# Patient Record
Sex: Female | Born: 2000 | Race: White | Hispanic: No | Marital: Single | Smoking: Never smoker
Health system: Southern US, Community
[De-identification: ages and names within clinical notes are randomized; demographics above are authoritative.]

## PROBLEM LIST (undated history)

## (undated) DIAGNOSIS — F419 Anxiety disorder, unspecified: Secondary | ICD-10-CM

## (undated) DIAGNOSIS — Z8709 Personal history of other diseases of the respiratory system: Secondary | ICD-10-CM

## (undated) DIAGNOSIS — F819 Developmental disorder of scholastic skills, unspecified: Secondary | ICD-10-CM

## (undated) DIAGNOSIS — F909 Attention-deficit hyperactivity disorder, unspecified type: Secondary | ICD-10-CM

## (undated) DIAGNOSIS — J309 Allergic rhinitis, unspecified: Secondary | ICD-10-CM

## (undated) DIAGNOSIS — J45909 Unspecified asthma, uncomplicated: Secondary | ICD-10-CM

## (undated) DIAGNOSIS — G43909 Migraine, unspecified, not intractable, without status migrainosus: Secondary | ICD-10-CM

## (undated) DIAGNOSIS — L309 Dermatitis, unspecified: Secondary | ICD-10-CM

## (undated) DIAGNOSIS — R42 Dizziness and giddiness: Secondary | ICD-10-CM

## (undated) HISTORY — DX: Anxiety disorder, unspecified: F41.9

## (undated) HISTORY — DX: Developmental disorder of scholastic skills, unspecified: F81.9

## (undated) HISTORY — DX: Dizziness and giddiness: R42

## (undated) HISTORY — DX: Unspecified asthma, uncomplicated: J45.909

## (undated) HISTORY — DX: Personal history of other diseases of the respiratory system: Z87.09

## (undated) HISTORY — DX: Dermatitis, unspecified: L30.9

## (undated) HISTORY — DX: Allergic rhinitis, unspecified: J30.9

## (undated) HISTORY — DX: Migraine, unspecified, not intractable, without status migrainosus: G43.909

## (undated) HISTORY — PX: NO PAST SURGERIES: SHX2092

## (undated) HISTORY — DX: Attention-deficit hyperactivity disorder, unspecified type: F90.9

---
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
## 2022-01-04 DIAGNOSIS — Z3046 Encounter for surveillance of implantable subdermal contraceptive: Secondary | ICD-10-CM | POA: Diagnosis not present

## 2022-02-06 DIAGNOSIS — H5213 Myopia, bilateral: Secondary | ICD-10-CM | POA: Diagnosis not present

## 2022-03-05 ENCOUNTER — Ambulatory Visit
Admission: EM | Admit: 2022-03-05 | Discharge: 2022-03-05 | Disposition: A | Discharge status: Home / Self Care | Payer: Medicaid Other | Attending: Nurse Practitioner | Admitting: Nurse Practitioner

## 2022-03-05 DIAGNOSIS — H60332 Swimmer's ear, left ear: Secondary | ICD-10-CM | POA: Diagnosis not present

## 2022-03-05 MED ORDER — OFLOXACIN 0.3 % OT SOLN: 10.0000 [drp] | Freq: Every day | OTIC | 0 refills | Status: AC

## 2022-03-05 NOTE — ED Triage Notes (Signed)
Pt presents with left side ear pan that began on Friday

## 2022-03-05 NOTE — Discharge Instructions (Signed)
-   Please start the eardrops for your left ear and use them daily for 7 days -Use good ear precautions and try not to get any water in your ear until it heals -Follow-up with primary care provider or here in urgent care if your symptoms persist or worsen despite treatment

## 2022-03-05 NOTE — ED Provider Notes (Signed)
[facility]    CSN: [id number] Arrival date & time: 03/05/22  0954      History   Chief Complaint Chief Complaint  Patient presents with   Otalgia    HPI ISLAND BESIC is a 21 y.o. female.   Patient presents with left ear pain for the past 4 days.  She denies fever, cough, congestion, drainage from the ear.  She reports her ear feels clogged.  She also feels like there is a "knot" behind her left ear.  Reports about 1 week ago, she was swimming in a pool and going underwater.  She denies use of Q-tips.    Past Medical History:  Diagnosis Date   ADHD    Allergic rhinitis    Anxiety    Asthma    Eczema    History of asthma    Learning disabilities    Migraines    Vertigo     Patient Active Problem List   Diagnosis Date Noted   GAD (generalized anxiety disorder) 11/30/2021   COVID-19 07/18/2021   Annual physical exam 07/04/2021   Fatigue 03/23/2021   Migraine without aura and without status migrainosus, not intractable 07/01/2018   Vertigo 07/01/2018   Irregular menses 06/13/2017   Attention deficit hyperactivity disorder (ADHD), predominantly inattentive type 06/13/2017   Acne vulgaris 06/13/2017   Cardiac chest pain in pediatric patient 06/13/2017    Past Surgical History:  Procedure Laterality Date   NO PAST SURGERIES      OB History   No obstetric history on file.      Home Medications    Prior to Admission medications   Medication Sig Start Date End Date Taking? Authorizing Provider  ofloxacin (FLOXIN) 0.3 % OTIC solution Place 10 drops into the left ear daily for 7 days. 03/05/22 03/12/22 Yes Noemi Chapel A, NP  escitalopram (LEXAPRO) 5 MG tablet Take 1 tablet (5 mg total) by mouth daily. 11/30/21   Lindell Spar, MD  etonogestrel (NEXPLANON) 68 MG IMPL implant 1 each by Subdermal route once.    [provider]  hydrOXYzine (VISTARIL) 25 MG capsule Take 1 capsule (25 mg total) by mouth every 8 (eight) hours as needed for  anxiety. 11/30/21   Lindell Spar, MD  ibuprofen (ADVIL) 600 MG tablet Take 1 tablet (600 mg total) by mouth every 8 (eight) hours as needed. 11/07/21   Lindell Spar, MD  meclizine (ANTIVERT) 12.5 MG tablet Take 1 tablet (12.5 mg total) by mouth 3 (three) times daily as needed for dizziness. 07/04/21   Renee Rival, FNP    Family History Family History  Problem Relation Age of Onset   Hyperlipidemia Mother    Diabetes Maternal Grandmother    Hypertension Maternal Grandmother    Kidney disease Maternal Grandmother    Mental illness Maternal Grandmother    Heart attack Maternal Grandfather     Social History Social History   Tobacco Use   Smoking status: Never   Smokeless tobacco: Never  Vaping Use   Vaping Use: Never used  Substance Use Topics   Alcohol use: No   Drug use: No     Allergies   Patient has no known allergies.   Review of Systems Review of Systems Per HPI  Physical Exam Triage Vital Signs ED Triage Vitals  Enc Vitals Group     BP 03/05/22 1021 118/83     Pulse Rate 03/05/22 1021 85     Resp 03/05/22 1021 20  Temp 03/05/22 1021 98.6 F (37 C)     Temp src --      SpO2 03/05/22 1021 98 %     Weight --      Height --      Head Circumference --      Peak Flow --      Pain Score 03/05/22 1019 7     Pain Loc --      Pain Edu? --      Excl. in GC? --    No data found.  Updated Vital Signs BP 118/83   Pulse 85   Temp 98.6 F (37 C)   Resp 20   LMP 03/04/2022   SpO2 98%   Visual Acuity Right Eye Distance:   Left Eye Distance:   Bilateral Distance:    Right Eye Near:   Left Eye Near:    Bilateral Near:     Physical Exam Vitals and nursing note reviewed.  Constitutional:      General: She is not in acute distress.    Appearance: Normal appearance. She is not toxic-appearing.  HENT:     Right Ear: Tympanic membrane, ear canal and external ear normal. There is no impacted cerumen.     Left Ear: Swelling present.  No middle  ear effusion. Tympanic membrane is not injected, scarred, perforated, erythematous, retracted or bulging.     Ears:     Comments: Left external auditory canal edematous, erythematous.  There is pain with manipulation of the left pinna.    Nose: Nose normal. No congestion.     Mouth/Throat:     Mouth: Mucous membranes are moist.     Pharynx: Oropharynx is clear.  Eyes:     General: No scleral icterus.    Extraocular Movements: Extraocular movements intact.  Pulmonary:     Effort: Pulmonary effort is normal. No respiratory distress.  Skin:    General: Skin is warm and dry.     Coloration: Skin is not jaundiced or pale.     Findings: No erythema.  Neurological:     Mental Status: She is alert and oriented to person, place, and time.  Psychiatric:        Behavior: Behavior is cooperative.      UC Treatments / Results  Labs (all labs ordered are listed, but only abnormal results are displayed) Labs Reviewed - No data to display  EKG   Radiology No results found.  Procedures Procedures (including critical care time)  Medications Ordered in UC Medications - No data to display  Initial Impression / Assessment and Plan / UC Course  I have reviewed the triage vital signs and the nursing notes.  Pertinent labs & imaging results that were available during my care of the patient were reviewed by me and considered in my medical decision making (see chart for details).    Patient is a very pleasant, well-appearing 21 year old female presenting for left ear pain today.  No red flags in history or on exam.  On examination, she appears to have otitis externa of the left ear.  Treat with ofloxacin drops for 7 days.  Ear precautions discussed.  Seek care if symptoms persist worsen despite treatment.  The patient was given the opportunity to ask questions.  All questions answered to their satisfaction.  The patient is in agreement to this plan.   Final Clinical Impressions(s) / UC  Diagnoses   Final diagnoses:  Acute swimmer's ear of left side  Discharge Instructions      - Please start the eardrops for your left ear and use them daily for 7 days -Use good ear precautions and try not to get any water in your ear until it heals -Follow-up with primary care provider or here in urgent care if your symptoms persist or worsen despite treatment     ED Prescriptions     Medication Sig Dispense Auth. Provider   ofloxacin (FLOXIN) 0.3 % OTIC solution Place 10 drops into the left ear daily for 7 days. 5 mL Valentino Nose, NP      PDMP not reviewed this encounter.   Valentino Nose, NP 03/05/22 1042

## 2022-03-07 ENCOUNTER — Encounter: Payer: Self-pay | Admitting: Internal Medicine

## 2022-03-07 ENCOUNTER — Ambulatory Visit (INDEPENDENT_AMBULATORY_CARE_PROVIDER_SITE_OTHER): Payer: Medicaid Other | Admitting: Internal Medicine

## 2022-03-07 VITALS — BP 118/84 | HR 50 | Resp 16 | Ht 67.0 in | Wt 195.0 lb

## 2022-03-07 DIAGNOSIS — Z8669 Personal history of other diseases of the nervous system and sense organs: Secondary | ICD-10-CM

## 2022-03-07 DIAGNOSIS — H60392 Other infective otitis externa, left ear: Secondary | ICD-10-CM | POA: Diagnosis not present

## 2022-03-07 DIAGNOSIS — H6090 Unspecified otitis externa, unspecified ear: Secondary | ICD-10-CM | POA: Insufficient documentation

## 2022-03-07 MED ORDER — AZITHROMYCIN 250 MG PO TABS: ORAL_TABLET | ORAL | 0 refills | Status: AC

## 2022-03-07 NOTE — Assessment & Plan Note (Signed)
Recent swimming activity On ofloxacin otic drops Added Oral azithromycin Referred to ENT specialist as she reports recurrent ear infections in the past

## 2022-03-07 NOTE — Progress Notes (Signed)
Acute Office Visit  Subjective:    Patient ID: Paula Pace, female    DOB: 07-07-2001, 21 y.o.   MRN: [id number]  Chief Complaint  Patient presents with   Ear Pain    Patient has been having left ear pain since 03-02-22 she went to urgent care 03-04-22 was told ear infection and given drops but since the ear is swollen and still hurts really bad     HPI Patient is in today for complaint of left ear pain and discharge for the last 5 days.  She reports going for swimming about a week ago before her symptoms started. She denies any fever or chills.  She has been to urgent care, and was given ofloxacin eardrops.  She states that her symptoms are getting worse despite using eardrops for the 3 days.  She also reports tinnitus and dull hearing as well.  Past Medical History:  Diagnosis Date   ADHD    Allergic rhinitis    Anxiety    Asthma    COVID-19 07/18/2021   Eczema    History of asthma    Learning disabilities    Migraines    Vertigo     Past Surgical History:  Procedure Laterality Date   NO PAST SURGERIES      Family History  Problem Relation Age of Onset   Hyperlipidemia Mother    Diabetes Maternal Grandmother    Hypertension Maternal Grandmother    Kidney disease Maternal Grandmother    Mental illness Maternal Grandmother    Heart attack Maternal Grandfather     Social History   Socioeconomic History   Marital status: Single    Spouse name: Not on file   Number of children: 0   Years of education: Not on file   Highest education level: Not on file  Occupational History   Occupation: Educational psychologist- wants to get CNA    Comment: has application to [employer] on [employer].  Tobacco Use   Smoking status: Never   Smokeless tobacco: Never  Vaping Use   Vaping Use: Never used  Substance and Sexual Activity   Alcohol use: No   Drug use: No   Sexual activity: Yes    Birth control/protection: Implant    Comment: has sexual, uses condom  Other Topics  Concern   Not on file  Social History Narrative   No longer at [employer], dropped out. Interested in starting at [employer]   She lives with both parents. She has two siblings.   She enjoys cleaning, singing and watching tv.            Social Determinants of Health   Financial Resource Strain: Not on file  Food Insecurity: Not on file  Transportation Needs: Not on file  Physical Activity: Not on file  Stress: Not on file  Social Connections: Not on file  Intimate Partner Violence: Not on file    Outpatient Medications Prior to Visit  Medication Sig Dispense Refill   escitalopram (LEXAPRO) 5 MG tablet Take 1 tablet (5 mg total) by mouth daily. 30 tablet 3   etonogestrel (NEXPLANON) 68 MG IMPL implant 1 each by Subdermal route once.     hydrOXYzine (VISTARIL) 25 MG capsule Take 1 capsule (25 mg total) by mouth every 8 (eight) hours as needed for anxiety. 30 capsule 1   ibuprofen (ADVIL) 600 MG tablet Take 1 tablet (600 mg total) by mouth every 8 (eight) hours as needed. 30 tablet 0  meclizine (ANTIVERT) 12.5 MG tablet Take 1 tablet (12.5 mg total) by mouth 3 (three) times daily as needed for dizziness. 30 tablet 0   ofloxacin (FLOXIN) 0.3 % OTIC solution Place 10 drops into the left ear daily for 7 days. 5 mL 0   No facility-administered medications prior to visit.    No Known Allergies  Review of Systems  Constitutional:  Negative for chills and fever.  HENT:  Positive for ear discharge, ear pain and tinnitus. Negative for congestion, sinus pressure, sinus pain and sore throat.   Eyes:  Negative for pain and discharge.  Respiratory:  Negative for cough and shortness of breath.   Cardiovascular:  Negative for chest pain and palpitations.  Gastrointestinal:  Negative for nausea and vomiting.  Genitourinary:  Negative for dysuria and hematuria.  Musculoskeletal:  Negative for neck pain and neck stiffness.  Skin:  Negative for rash.  Neurological:  Negative for  dizziness and weakness.  Psychiatric/Behavioral:  Negative for agitation and behavioral problems.        Objective:    Physical Exam Constitutional:      General: She is not in acute distress.    Appearance: She is not diaphoretic.  HENT:     Right Ear: There is no impacted cerumen.     Left Ear: Swelling and tenderness present. There is no impacted cerumen.     Nose: No congestion.     Mouth/Throat:     Mouth: Mucous membranes are dry.     Pharynx: No posterior oropharyngeal erythema.  Eyes:     General: No scleral icterus.    Extraocular Movements: Extraocular movements intact.  Cardiovascular:     Heart sounds: Normal heart sounds. No murmur heard. Pulmonary:     Breath sounds: Normal breath sounds. No wheezing or rales.  Neurological:     General: No focal deficit present.     Mental Status: She is alert and oriented to person, place, and time.     BP 118/84 (BP Location: Right Arm, Patient Position: Sitting, Cuff Size: Normal)   Pulse (!) 50   Resp 16   Ht 5\' 7"  (1.702 m)   Wt 195 lb (88.5 kg)   LMP 03/04/2022   SpO2 100%   BMI 30.54 kg/m  Wt Readings from Last 3 Encounters:  03/07/22 195 lb (88.5 kg) (97 %, Z= 1.88)*  11/07/21 199 lb 9.6 oz (90.5 kg) (97 %, Z= 1.95)*  07/04/21 197 lb 1.3 oz (89.4 kg) (97 %, Z= 1.93)*   * Growth percentiles are based on CDC (Girls, 2-20 Years) data.        Assessment & Plan:   Problem List Items Addressed This Visit       Nervous and Auditory   Otitis externa - Primary    Recent swimming activity On ofloxacin otic drops Added Oral azithromycin Referred to ENT specialist as she reports recurrent ear infections in the past      Relevant Medications   azithromycin (ZITHROMAX) 250 MG tablet   Other Visit Diagnoses     History of recurrent ear infection       Relevant Orders   Ambulatory referral to ENT        Meds ordered this encounter  Medications   azithromycin (ZITHROMAX) 250 MG tablet    Sig: Take 2  tablets on day 1, then 1 tablet daily on days 2 through 5    Dispense:  6 tablet    Refill:  0  Paula Dillie K Shaunika Italiano, MD 

## 2022-04-10 ENCOUNTER — Encounter: Payer: Self-pay | Admitting: Internal Medicine

## 2022-04-10 ENCOUNTER — Ambulatory Visit (INDEPENDENT_AMBULATORY_CARE_PROVIDER_SITE_OTHER): Payer: Medicaid Other | Admitting: Internal Medicine

## 2022-04-10 VITALS — BP 118/82 | HR 82 | Resp 18 | Ht 67.0 in | Wt 194.4 lb

## 2022-04-10 DIAGNOSIS — N926 Irregular menstruation, unspecified: Secondary | ICD-10-CM

## 2022-04-10 DIAGNOSIS — J0140 Acute pansinusitis, unspecified: Secondary | ICD-10-CM | POA: Diagnosis not present

## 2022-04-10 DIAGNOSIS — J309 Allergic rhinitis, unspecified: Secondary | ICD-10-CM

## 2022-04-10 LAB — POCT URINE PREGNANCY: Preg Test, Ur: NEGATIVE

## 2022-04-10 MED ORDER — AZITHROMYCIN 250 MG PO TABS: ORAL_TABLET | ORAL | 0 refills | Status: AC

## 2022-04-10 MED ORDER — FLUTICASONE PROPIONATE 50 MCG/ACT NA SUSP: 2.0000 | Freq: Every day | NASAL | 6 refills | Status: AC

## 2022-04-10 NOTE — Progress Notes (Signed)
Acute Office Visit  Subjective:    Patient ID: Paula Pace, female    DOB: 2001/01/07, 21 y.o.   MRN: [id number]  Chief Complaint  Patient presents with   Headache    Patient has had headache sore throat nausea since 04-07-22 did at home covid test was neg had abnormal period as well started on 9-3 was done 9-4 normally last 4 to 5 days    HPI Patient is in today for complaint of nasal congestion, postnasal drip, sore throat, sinus pressure related headache and pain behind her eyes for the last 4 days.  She also reports mild fever and chills, but denies any dyspnea or wheezing currently.  Her home COVID test was negative.  She also reports light bleeding for only 2 days this month.  She states that her menstrual cycle was lighter than her usual and also lasted only 2 days instead of her usual 4 to 5 days.  Denies any abdominal cramping, vaginal discharge or pelvic pain currently.  She recently had her Nexplanon removed.  Her urine pregnancy test was negative in the office.  Past Medical History:  Diagnosis Date   ADHD    Allergic rhinitis    Anxiety    Asthma    COVID-19 07/18/2021   Eczema    History of asthma    Learning disabilities    Migraines    Vertigo     Past Surgical History:  Procedure Laterality Date   NO PAST SURGERIES      Family History  Problem Relation Age of Onset   Hyperlipidemia Mother    Diabetes Maternal Grandmother    Hypertension Maternal Grandmother    Kidney disease Maternal Grandmother    Mental illness Maternal Grandmother    Heart attack Maternal Grandfather     Social History   Socioeconomic History   Marital status: Single    Spouse name: Not on file   Number of children: 0   Years of education: Not on file   Highest education level: Not on file  Occupational History   Occupation: Heritage manager- wants to get CNA    Comment: has application to [employer] on [address].  Tobacco Use   Smoking status: Never   Smokeless  tobacco: Never  Vaping Use   Vaping Use: Never used  Substance and Sexual Activity   Alcohol use: No   Drug use: No   Sexual activity: Yes    Birth control/protection: Implant    Comment: has sexual, uses condom  Other Topics Concern   Not on file  Social History Narrative   No longer at [employer] HS, dropped out. Interested in starting at [employer]   She lives with both parents. She has two siblings.   She enjoys cleaning, singing and watching tv.            Social Determinants of Health   Financial Resource Strain: Not on file  Food Insecurity: Not on file  Transportation Needs: Not on file  Physical Activity: Not on file  Stress: Not on file  Social Connections: Not on file  Intimate Partner Violence: Not on file    Outpatient Medications Prior to Visit  Medication Sig Dispense Refill   escitalopram (LEXAPRO) 5 MG tablet Take 1 tablet (5 mg total) by mouth daily. 30 tablet 3   hydrOXYzine (VISTARIL) 25 MG capsule Take 1 capsule (25 mg total) by mouth every 8 (eight) hours as needed for anxiety. 30 capsule 1  ibuprofen (ADVIL) 600 MG tablet Take 1 tablet (600 mg total) by mouth every 8 (eight) hours as needed. 30 tablet 0   meclizine (ANTIVERT) 12.5 MG tablet Take 1 tablet (12.5 mg total) by mouth 3 (three) times daily as needed for dizziness. 30 tablet 0   etonogestrel (NEXPLANON) 68 MG IMPL implant 1 each by Subdermal route once. (Patient not taking: Reported on 04/10/2022)     No facility-administered medications prior to visit.    No Known Allergies  Review of Systems  Constitutional:  Positive for chills and fever.  HENT:  Positive for congestion, sinus pressure, sinus pain and sore throat.   Eyes:  Negative for pain and discharge.  Respiratory:  Negative for cough and shortness of breath.   Cardiovascular:  Negative for chest pain and palpitations.  Gastrointestinal:  Negative for nausea and vomiting.  Genitourinary:  Negative for dysuria and hematuria.   Musculoskeletal:  Negative for neck pain and neck stiffness.  Skin:  Negative for rash.  Neurological:  Positive for headaches. Negative for dizziness and weakness.  Psychiatric/Behavioral:  Negative for agitation and behavioral problems.        Objective:    Physical Exam Constitutional:      General: She is not in acute distress.    Appearance: She is not diaphoretic.  HENT:     Nose: Congestion present.     Right Sinus: Frontal sinus tenderness present.     Left Sinus: Frontal sinus tenderness present.     Mouth/Throat:     Mouth: Mucous membranes are dry.     Pharynx: No posterior oropharyngeal erythema.  Eyes:     General: No scleral icterus.    Extraocular Movements: Extraocular movements intact.  Cardiovascular:     Rate and Rhythm: Normal rate and regular rhythm.     Heart sounds: Normal heart sounds. No murmur heard. Pulmonary:     Breath sounds: Normal breath sounds. No wheezing or rales.  Neurological:     General: No focal deficit present.     Mental Status: She is alert and oriented to person, place, and time.     BP 118/82 (BP Location: Right Arm, Patient Position: Sitting, Cuff Size: Normal)   Pulse 82   Resp 18   Ht 5\' 7"  (1.702 m)   Wt 194 lb 6.4 oz (88.2 kg)   SpO2 99%   BMI 30.45 kg/m  Wt Readings from Last 3 Encounters:  04/10/22 194 lb 6.4 oz (88.2 kg) (97 %, Z= 1.87)*  03/07/22 195 lb (88.5 kg) (97 %, Z= 1.88)*  11/07/21 199 lb 9.6 oz (90.5 kg) (97 %, Z= 1.95)*   * Growth percentiles are based on CDC (Girls, 2-20 Years) data.        Assessment & Plan:   Problem List Items Addressed This Visit    Visit Diagnoses     Acute non-recurrent pansinusitis    -  Primary Home COVID test negative Started empiric azithromycin as she has persistent symptoms despite symptomatic treatment Added Flonase for allergies/nasal congestion Advised to perform sinus rinse   Relevant Medications   fluticasone (FLONASE) 50 MCG/ACT nasal spray    azithromycin (ZITHROMAX) 250 MG tablet   Allergic sinusitis       Relevant Medications   fluticasone (FLONASE) 50 MCG/ACT nasal spray   azithromycin (ZITHROMAX) 250 MG tablet   Abnormal menstrual periods     UPT negative today Could be due to recent Nexplanon removal   Relevant Orders   POCT urine  pregnancy (Completed)        Meds ordered this encounter  Medications   fluticasone (FLONASE) 50 MCG/ACT nasal spray    Sig: Place 2 sprays into both nostrils daily.    Dispense:  16 g    Refill:  6   azithromycin (ZITHROMAX) 250 MG tablet    Sig: Take 2 tablets on day 1, then 1 tablet daily on days 2 through 5    Dispense:  6 tablet    Refill:  0     Keen Ewalt Concha Se, MD

## 2022-04-10 NOTE — Patient Instructions (Signed)
Please start taking Azithromycin as prescribed.  Please start using Flonase for nasal congestion and allergies.

## 2022-06-04 ENCOUNTER — Ambulatory Visit
Admission: EM | Admit: 2022-06-04 | Discharge: 2022-06-04 | Disposition: A | Discharge status: Home / Self Care | Payer: Medicaid Other

## 2022-06-04 DIAGNOSIS — J039 Acute tonsillitis, unspecified: Secondary | ICD-10-CM | POA: Insufficient documentation

## 2022-06-04 DIAGNOSIS — J029 Acute pharyngitis, unspecified: Secondary | ICD-10-CM | POA: Insufficient documentation

## 2022-06-04 LAB — POCT RAPID STREP A (OFFICE): Rapid Strep A Screen: NEGATIVE

## 2022-06-04 NOTE — Discharge Instructions (Addendum)
Rapid strep throat test is negative today.  Throat culture is pending.  Most likely, the cause of your sore throat is a viral infection.  Please start warm salt water gargles, Cepacol lozenges, Chloraseptic spray, and Tylenol/ibuprofen as needed to help with pain  We will call you in a couple of days if the throat culture comes back positive which revealed prior treatment antibiotics.  Otherwise, your symptoms should last less than a week and will hopefully improve in that timeframe.

## 2022-06-04 NOTE — ED Provider Notes (Signed)
[facility]    CSN: [id number] Arrival date & time: 06/04/22  1440      History   Chief Complaint Chief Complaint  Patient presents with   Sore Throat    HPI Paula Pace is a 21 y.o. female.   Patient presents with mother today for 1 day of sore throat.  She denies fever, cough, shortness of breath or chest pain.  No chest or nasal congestion, runny nose, postnasal drainage, abdominal pain, nausea/vomiting, diarrhea, decreased appetite, loss of taste or smell, or new rash.  Reports she has also developed a headache and energy levels have been down today.  Reports that she has been exposed to her sister and nephew who both have strep throat.  She reports a personal history of strep throat "a long time ago".  Has not taken anything for symptoms so far.    Past Medical History:  Diagnosis Date   ADHD    Allergic rhinitis    Anxiety    Asthma    COVID-19 07/18/2021   Eczema    History of asthma    Learning disabilities    Migraines    Vertigo     Patient Active Problem List   Diagnosis Date Noted   Allergic sinusitis 04/10/2022   Otitis externa 03/07/2022   GAD (generalized anxiety disorder) 11/30/2021   Annual physical exam 07/04/2021   Fatigue 03/23/2021   Migraine without aura and without status migrainosus, not intractable 07/01/2018   Vertigo 07/01/2018   Irregular menses 06/13/2017   Attention deficit hyperactivity disorder (ADHD), predominantly inattentive type 06/13/2017   Acne vulgaris 06/13/2017   Cardiac chest pain in pediatric patient 06/13/2017    Past Surgical History:  Procedure Laterality Date   NO PAST SURGERIES      OB History   No obstetric history on file.      Home Medications    Prior to Admission medications   Medication Sig Start Date End Date Taking? Authorizing Provider  acetaminophen (TYLENOL) 500 MG tablet Take 500 mg by mouth every 6 (six) hours as needed.   Yes [provider]  escitalopram  (LEXAPRO) 5 MG tablet Take 1 tablet (5 mg total) by mouth daily. 11/30/21   Lindell Spar, MD  fluticasone (FLONASE) 50 MCG/ACT nasal spray Place 2 sprays into both nostrils daily. 04/10/22   Lindell Spar, MD  hydrOXYzine (VISTARIL) 25 MG capsule Take 1 capsule (25 mg total) by mouth every 8 (eight) hours as needed for anxiety. 11/30/21   Lindell Spar, MD  ibuprofen (ADVIL) 600 MG tablet Take 1 tablet (600 mg total) by mouth every 8 (eight) hours as needed. 11/07/21   Lindell Spar, MD  meclizine (ANTIVERT) 12.5 MG tablet Take 1 tablet (12.5 mg total) by mouth 3 (three) times daily as needed for dizziness. 07/04/21   Renee Rival, FNP    Family History Family History  Problem Relation Age of Onset   Hyperlipidemia Mother    Diabetes Maternal Grandmother    Hypertension Maternal Grandmother    Kidney disease Maternal Grandmother    Mental illness Maternal Grandmother    Heart attack Maternal Grandfather     Social History Social History   Tobacco Use   Smoking status: Never   Smokeless tobacco: Never  Vaping Use   Vaping Use: Never used  Substance Use Topics   Alcohol use: Never   Drug use: Never     Allergies   Patient has no known allergies.  Review of Systems Review of Systems Per HPI  Physical Exam Triage Vital Signs ED Triage Vitals  Enc Vitals Group     BP 06/04/22 1545 111/75     Pulse Rate 06/04/22 1545 73     Resp 06/04/22 1545 16     Temp 06/04/22 1545 98.5 F (36.9 C)     Temp Source 06/04/22 1545 Oral     SpO2 06/04/22 1545 98 %     Weight --      Height --      Head Circumference --      Peak Flow --      Pain Score 06/04/22 1543 6     Pain Loc --      Pain Edu? --      Excl. in GC? --    No data found.  Updated Vital Signs BP 111/75 (BP Location: Right Arm)   Pulse 73   Temp 98.5 F (36.9 C) (Oral)   Resp 16   LMP 04/29/2022 (Exact Date)   SpO2 98%   Visual Acuity Right Eye Distance:   Left Eye Distance:   Bilateral  Distance:    Right Eye Near:   Left Eye Near:    Bilateral Near:     Physical Exam Vitals and nursing note reviewed.  Constitutional:      General: She is not in acute distress.    Appearance: She is well-developed. She is not toxic-appearing.  HENT:     Head: Normocephalic and atraumatic.     Right Ear: Tympanic membrane and ear canal normal. No drainage, swelling or tenderness. No middle ear effusion. Tympanic membrane is not erythematous.     Left Ear: Tympanic membrane and ear canal normal. No drainage, swelling or tenderness.  No middle ear effusion. Tympanic membrane is not erythematous.     Nose: No congestion or rhinorrhea.     Mouth/Throat:     Mouth: Mucous membranes are moist.     Pharynx: Oropharynx is clear. Uvula midline. No oropharyngeal exudate or posterior oropharyngeal erythema.     Tonsils: No tonsillar exudate. 2+ on the right. 2+ on the left.  Eyes:     Extraocular Movements:     Right eye: Normal extraocular motion.     Left eye: Normal extraocular motion.  Cardiovascular:     Rate and Rhythm: Normal rate and regular rhythm.  Pulmonary:     Effort: Pulmonary effort is normal. No respiratory distress.     Breath sounds: Normal breath sounds. No wheezing, rhonchi or rales.  Musculoskeletal:     Cervical back: Normal range of motion and neck supple.  Lymphadenopathy:     Cervical: No cervical adenopathy.  Skin:    General: Skin is warm and dry.     Capillary Refill: Capillary refill takes less than 2 seconds.     Coloration: Skin is not pale.     Findings: No erythema or rash.  Neurological:     Mental Status: She is alert and oriented to person, place, and time.  Psychiatric:        Behavior: Behavior is cooperative.      UC Treatments / Results  Labs (all labs ordered are listed, but only abnormal results are displayed) Labs Reviewed  CULTURE, GROUP A STREP [facility])  POCT RAPID STREP A (OFFICE)    EKG   Radiology No results  found.  Procedures Procedures (including critical care time)  Medications Ordered in UC Medications - No data to display  Initial Impression / Assessment and Plan / UC Course  I have reviewed the triage vital signs and the nursing notes.  Pertinent labs & imaging results that were available during my care of the patient were reviewed by me and considered in my medical decision making (see chart for details).   Patient is well-appearing, normotensive, afebrile, not tachycardic, not tachypneic, oxygenating well on room air.    Acute pharyngitis, unspecified etiology Acute tonsillitis, unspecified etiology Rapid strep throat test negative, throat culture is pending Suspect viral etiology, discussed with patient and mother Supportive care discussed ER return precautions discussed   The patient was given the opportunity to ask questions.  All questions answered to their satisfaction.  The patient is in agreement to this plan.    Final Clinical Impressions(s) / UC Diagnoses   Final diagnoses:  Acute pharyngitis, unspecified etiology  Acute tonsillitis, unspecified etiology     Discharge Instructions      Rapid strep throat test is negative today.  Throat culture is pending.  Most likely, the cause of your sore throat is a viral infection.  Please start warm salt water gargles, Cepacol lozenges, Chloraseptic spray, and Tylenol/ibuprofen as needed to help with pain  We will call you in a couple of days if the throat culture comes back positive which revealed prior treatment antibiotics.  Otherwise, your symptoms should last less than a week and will hopefully improve in that timeframe.     ED Prescriptions   None    PDMP not reviewed this encounter.   Valentino Nose, NP 06/04/22 1627

## 2022-06-04 NOTE — ED Triage Notes (Signed)
Pt reports sore throat since this morning. Tylenol gives some relief

## 2022-06-07 LAB — CULTURE, GROUP A STREP (THRC)

## 2022-06-11 ENCOUNTER — Telehealth (HOSPITAL_COMMUNITY): Payer: Self-pay | Admitting: Emergency Medicine

## 2022-06-11 NOTE — Telephone Encounter (Signed)
Patient had non-Group A strep on culture, per Shanda Bumps, APP call to check on patient.   Patient states she is 100% better, no change to plan at this time

## 2022-07-05 ENCOUNTER — Encounter: Payer: Self-pay | Admitting: Internal Medicine

## 2022-07-05 ENCOUNTER — Encounter: Payer: Medicaid Other | Admitting: Internal Medicine

## 2022-07-09 ENCOUNTER — Ambulatory Visit: Payer: Self-pay

## 2022-09-08 DIAGNOSIS — N390 Urinary tract infection, site not specified: Secondary | ICD-10-CM | POA: Diagnosis not present

## 2022-09-08 DIAGNOSIS — N760 Acute vaginitis: Secondary | ICD-10-CM | POA: Diagnosis not present

## 2022-09-08 DIAGNOSIS — R300 Dysuria: Secondary | ICD-10-CM | POA: Diagnosis not present

## 2022-10-23 ENCOUNTER — Telehealth: Payer: Self-pay | Admitting: Internal Medicine

## 2022-10-23 ENCOUNTER — Encounter: Payer: Medicaid Other | Admitting: Internal Medicine

## 2022-10-23 NOTE — Telephone Encounter (Signed)
Pt has missed 3 appts (09/27/21, 07/05/22 & 10/23/22). How would you like to proceed?

## 2022-10-25 ENCOUNTER — Encounter: Payer: Self-pay | Admitting: Internal Medicine

## 2023-01-22 ENCOUNTER — Ambulatory Visit (INDEPENDENT_AMBULATORY_CARE_PROVIDER_SITE_OTHER): Payer: Medicaid Other | Admitting: Internal Medicine

## 2023-01-22 ENCOUNTER — Encounter: Payer: Self-pay | Admitting: Internal Medicine

## 2023-01-22 VITALS — BP 138/84 | HR 86 | Ht 67.0 in | Wt 201.8 lb

## 2023-01-22 DIAGNOSIS — Z1159 Encounter for screening for other viral diseases: Secondary | ICD-10-CM

## 2023-01-22 DIAGNOSIS — Z0001 Encounter for general adult medical examination with abnormal findings: Secondary | ICD-10-CM | POA: Diagnosis not present

## 2023-01-22 DIAGNOSIS — E669 Obesity, unspecified: Secondary | ICD-10-CM

## 2023-01-22 DIAGNOSIS — Z3202 Encounter for pregnancy test, result negative: Secondary | ICD-10-CM | POA: Diagnosis not present

## 2023-01-22 DIAGNOSIS — N926 Irregular menstruation, unspecified: Secondary | ICD-10-CM | POA: Diagnosis not present

## 2023-01-22 DIAGNOSIS — Z114 Encounter for screening for human immunodeficiency virus [HIV]: Secondary | ICD-10-CM | POA: Diagnosis not present

## 2023-01-22 DIAGNOSIS — Z118 Encounter for screening for other infectious and parasitic diseases: Secondary | ICD-10-CM | POA: Diagnosis not present

## 2023-01-22 LAB — POCT PREGNANCY, URINE

## 2023-01-22 NOTE — Assessment & Plan Note (Signed)
Was on OC pills UPT negative today Has OB/GYN in [place]

## 2023-01-22 NOTE — Progress Notes (Signed)
Established Patient Office Visit  Subjective:  Patient ID: Paula Pace, female    DOB: 02-07-2001  Age: 22 y.o. MRN: [id number]  CC:  Chief Complaint  Patient presents with   Annual Exam    HPI Paula Pace is a 22 y.o. female with past medical history of GAD who presents for annual physical.  She was able to taper down Lexapro that she was taking for GAD.  She denies anxiety spells since stopping oral contraceptive pills.  She reports missed menstrual cycle.  Her last menstrual period was more than a month ago, but does not recall exact date.  She is sexually active currently.  Denies any vaginal bleeding or spotting or discharge currently.  Denies any pelvic pain currently.  Her UPT was negative today.  She has OB/GYN in [place].   Past Medical History:  Diagnosis Date   ADHD    Allergic rhinitis    Anxiety    Asthma    COVID-19 07/18/2021   Eczema    History of asthma    Learning disabilities    Migraines    Vertigo     Past Surgical History:  Procedure Laterality Date   NO PAST SURGERIES      Family History  Problem Relation Age of Onset   Hyperlipidemia Mother    Diabetes Maternal Grandmother    Hypertension Maternal Grandmother    Kidney disease Maternal Grandmother    Mental illness Maternal Grandmother    Heart attack Maternal Grandfather     Social History   Socioeconomic History   Marital status: Single    Spouse name: Not on file   Number of children: 0   Years of education: Not on file   Highest education level: Not on file  Occupational History   Occupation: Educational psychologist- wants to get CNA    Comment: has application to [employer] on [employer].  Tobacco Use   Smoking status: Never   Smokeless tobacco: Never  Vaping Use   Vaping Use: Never used  Substance and Sexual Activity   Alcohol use: Never   Drug use: Never   Sexual activity: Yes    Birth control/protection: Condom    Comment: has sexual, uses condom  Other Topics  Concern   Not on file  Social History Narrative   No longer at [employer], dropped out. Interested in starting at [employer]   She lives with both parents. She has two siblings.   She enjoys cleaning, singing and watching tv.            Social Determinants of Health   Financial Resource Strain: Not on file  Food Insecurity: Not on file  Transportation Needs: Not on file  Physical Activity: Not on file  Stress: Not on file  Social Connections: Not on file  Intimate Partner Violence: Not on file    Outpatient Medications Prior to Visit  Medication Sig Dispense Refill   acetaminophen (TYLENOL) 500 MG tablet Take 500 mg by mouth every 6 (six) hours as needed. (Patient not taking: Reported on 01/22/2023)     fluticasone (FLONASE) 50 MCG/ACT nasal spray Place 2 sprays into both nostrils daily. (Patient not taking: Reported on 01/22/2023) 16 g 6   escitalopram (LEXAPRO) 5 MG tablet Take 1 tablet (5 mg total) by mouth daily. (Patient not taking: Reported on 01/22/2023) 30 tablet 3   hydrOXYzine (VISTARIL) 25 MG capsule Take 1 capsule (25 mg total) by mouth every 8 (eight) hours as  needed for anxiety. (Patient not taking: Reported on 01/22/2023) 30 capsule 1   ibuprofen (ADVIL) 600 MG tablet Take 1 tablet (600 mg total) by mouth every 8 (eight) hours as needed. (Patient not taking: Reported on 01/22/2023) 30 tablet 0   meclizine (ANTIVERT) 12.5 MG tablet Take 1 tablet (12.5 mg total) by mouth 3 (three) times daily as needed for dizziness. (Patient not taking: Reported on 01/22/2023) 30 tablet 0   No facility-administered medications prior to visit.    No Known Allergies  ROS Review of Systems  Constitutional:  Negative for chills and fever.  HENT:  Negative for congestion, sinus pressure, sinus pain and sore throat.   Eyes:  Negative for pain and discharge.  Respiratory:  Negative for cough and shortness of breath.   Cardiovascular:  Negative for chest pain and palpitations.   Gastrointestinal:  Negative for abdominal pain, constipation, diarrhea, nausea and vomiting.  Endocrine: Negative for polydipsia and polyuria.  Genitourinary:  Negative for dysuria and hematuria.  Musculoskeletal:  Negative for neck pain and neck stiffness.  Skin:  Negative for rash.  Neurological:  Negative for dizziness and weakness.  Psychiatric/Behavioral:  Negative for agitation and behavioral problems.       Objective:    Physical Exam Vitals reviewed.  Constitutional:      General: She is not in acute distress.    Appearance: She is obese. She is not diaphoretic.  HENT:     Head: Normocephalic and atraumatic.     Nose: Nose normal. No congestion.     Mouth/Throat:     Mouth: Mucous membranes are moist.     Pharynx: No posterior oropharyngeal erythema.  Eyes:     General: No scleral icterus.    Extraocular Movements: Extraocular movements intact.  Cardiovascular:     Rate and Rhythm: Normal rate and regular rhythm.     Heart sounds: Normal heart sounds. No murmur heard. Pulmonary:     Breath sounds: Normal breath sounds. No wheezing or rales.  Abdominal:     Palpations: Abdomen is soft.     Tenderness: There is no abdominal tenderness.  Musculoskeletal:     Cervical back: Neck supple. No tenderness.     Right lower leg: No edema.     Left lower leg: No edema.  Skin:    General: Skin is warm.     Findings: No rash.  Neurological:     General: No focal deficit present.     Mental Status: She is alert and oriented to person, place, and time.  Psychiatric:        Mood and Affect: Mood normal.        Behavior: Behavior normal.     BP 138/84 (BP Location: Right Arm, Patient Position: Sitting, Cuff Size: Normal)   Pulse 86   Ht 5\' 7"  (1.702 m)   Wt 201 lb 12.8 oz (91.5 kg)   SpO2 98%   BMI 31.61 kg/m  Wt Readings from Last 3 Encounters:  01/22/23 201 lb 12.8 oz (91.5 kg)  04/10/22 194 lb 6.4 oz (88.2 kg) (97 %, Z= 1.87)*  03/07/22 195 lb (88.5 kg) (97 %,  Z= 1.88)*   * Growth percentiles are based on CDC (Girls, 2-20 Years) data.    No results found for: "TSH" No results found for: "WBC", "HGB", "HCT", "MCV", "PLT" No results found for: "NA", "K", "CHLORIDE", "CO2", "GLUCOSE", "BUN", "CREATININE", "BILITOT", "ALKPHOS", "AST", "ALT", "PROT", "ALBUMIN", "CALCIUM", "ANIONGAP", "EGFR", "GFR" No results found for: "CHOL" No  results found for: "HDL" No results found for: "LDLCALC" No results found for: "TRIG" No results found for: "CHOLHDL" No results found for: "HGBA1C"    Assessment & Plan:   Problem List Items Addressed This Visit       Other   Irregular menses    Was on OC pills UPT negative today Has OB/GYN in [facility]      Encounter for general adult medical examination with abnormal findings - Primary    Physical exam as documented. Counseling done  re healthy lifestyle involving commitment to 150 minutes exercise per week, heart healthy diet, and attaining healthy weight.The importance of adequate sleep also discussed. Changes in health habits are decided on by the patient with goals and time frames  set for achieving them. Immunization and cancer screening needs are specifically addressed at this visit.      Relevant Orders   CBC with Differential/Platelet   CMP14+EGFR   Class 1 obesity without serious comorbidity in adult    Needs to follow low-carb diet and perform moderate exercise/walking at least 150 mins/week      Other Visit Diagnoses     Screening for HIV (human immunodeficiency virus)       Relevant Orders   HIV antibody (with reflex)   Need for hepatitis C screening test       Relevant Orders   Hepatitis C Antibody   Screening for chlamydial disease       Relevant Orders   Chlamydia/GC NAA, Confirmation   Encounter for pregnancy test with result negative       Relevant Orders   POCT Pregnancy, Urine (Completed)       No orders of the defined types were placed in this encounter.   Follow-up:  Return in about 1 year (around 01/22/2024) for Annual physical.    Anabel Halon, MD

## 2023-01-22 NOTE — Assessment & Plan Note (Signed)
Needs to follow low carb diet and perform moderate exercise/walking at least 150 mins/week 

## 2023-01-22 NOTE — Assessment & Plan Note (Signed)

## 2023-01-23 LAB — CMP14+EGFR
ALT: 23 IU/L (ref 0–32)
AST: 17 IU/L (ref 0–40)
Albumin: 4.5 g/dL (ref 4.0–5.0)
Alkaline Phosphatase: 60 IU/L (ref 42–106)
BUN/Creatinine Ratio: 16 (ref 9–23)
BUN: 14 mg/dL (ref 6–20)
Bilirubin Total: 0.7 mg/dL (ref 0.0–1.2)
CO2: 25 mmol/L (ref 20–29)
Calcium: 9.6 mg/dL (ref 8.7–10.2)
Chloride: 103 mmol/L (ref 96–106)
Creatinine, Ser: 0.88 mg/dL (ref 0.57–1.00)
Globulin, Total: 2.4 g/dL (ref 1.5–4.5)
Glucose: 82 mg/dL (ref 70–99)
Potassium: 4.6 mmol/L (ref 3.5–5.2)
Sodium: 142 mmol/L (ref 134–144)
Total Protein: 6.9 g/dL (ref 6.0–8.5)
eGFR: 96 mL/min/{1.73_m2} (ref >59)

## 2023-01-23 LAB — CBC WITH DIFFERENTIAL/PLATELET
Basophils Absolute: 0 10*3/uL (ref 0.0–0.2)
Basos: 1 %
EOS (ABSOLUTE): 0.2 10*3/uL (ref 0.0–0.4)
Eos: 2 %
Hematocrit: 42.4 % (ref 34.0–46.6)
Hemoglobin: 13.8 g/dL (ref 11.1–15.9)
Immature Grans (Abs): 0 10*3/uL (ref 0.0–0.1)
Immature Granulocytes: 0 %
Lymphocytes Absolute: 2.2 10*3/uL (ref 0.7–3.1)
Lymphs: 34 %
MCH: 29.2 pg (ref 26.6–33.0)
MCHC: 32.5 g/dL (ref 31.5–35.7)
MCV: 90 fL (ref 79–97)
Monocytes Absolute: 0.5 10*3/uL (ref 0.1–0.9)
Monocytes: 8 %
Neutrophils Absolute: 3.6 10*3/uL (ref 1.4–7.0)
Neutrophils: 55 %
Platelets: 264 10*3/uL (ref 150–450)
RBC: 4.73 x10E6/uL (ref 3.77–5.28)
RDW: 12.6 % (ref 11.7–15.4)
WBC: 6.5 10*3/uL (ref 3.4–10.8)

## 2023-01-23 LAB — HEPATITIS C ANTIBODY: Hep C Virus Ab: NONREACTIVE

## 2023-01-23 LAB — HIV ANTIBODY (ROUTINE TESTING W REFLEX): HIV Screen 4th Generation wRfx: NONREACTIVE

## 2023-01-24 LAB — CHLAMYDIA/GC NAA, CONFIRMATION
Chlamydia trachomatis, NAA: NEGATIVE
Neisseria gonorrhoeae, NAA: NEGATIVE

## 2023-04-11 ENCOUNTER — Encounter: Payer: Self-pay | Admitting: *Deleted

## 2023-10-10 DIAGNOSIS — N926 Irregular menstruation, unspecified: Secondary | ICD-10-CM | POA: Diagnosis not present

## 2023-10-10 DIAGNOSIS — N912 Amenorrhea, unspecified: Secondary | ICD-10-CM | POA: Diagnosis not present

## 2023-10-10 DIAGNOSIS — Z30011 Encounter for initial prescription of contraceptive pills: Secondary | ICD-10-CM | POA: Diagnosis not present

## 2024-01-23 ENCOUNTER — Encounter: Payer: Medicaid Other | Admitting: Internal Medicine

## 2024-01-27 ENCOUNTER — Encounter: Payer: Self-pay | Admitting: Internal Medicine

## 2024-04-17 ENCOUNTER — Encounter: Payer: Self-pay | Admitting: *Deleted
